# Patient Record
Sex: Female | Born: 1995 | Hispanic: No | Marital: Married | Smoking: Never smoker
Health system: Southern US, Community
[De-identification: ages and names within clinical notes are randomized; demographics above are authoritative.]

---

## 2018-04-14 ENCOUNTER — Other Ambulatory Visit: Payer: Self-pay

## 2018-04-14 ENCOUNTER — Emergency Department
Admit: 2018-04-14 | Discharge: 2018-04-14 | Disposition: A | Discharge status: Home / Self Care | Payer: Self-pay | Attending: Emergency Medicine | Admitting: Emergency Medicine

## 2018-04-14 ENCOUNTER — Emergency Department: Payer: Self-pay

## 2018-04-14 ENCOUNTER — Inpatient Hospital Stay
Admission: EM | Admit: 2018-04-14 | Discharge: 2018-04-16 | DRG: 316 | Disposition: A | Discharge status: Home / Self Care | Payer: Self-pay | Attending: Internal Medicine | Admitting: Internal Medicine

## 2018-04-14 DIAGNOSIS — E860 Dehydration: Secondary | ICD-10-CM | POA: Diagnosis present

## 2018-04-14 DIAGNOSIS — E876 Hypokalemia: Secondary | ICD-10-CM | POA: Diagnosis not present

## 2018-04-14 DIAGNOSIS — J020 Streptococcal pharyngitis: Secondary | ICD-10-CM | POA: Diagnosis present

## 2018-04-14 DIAGNOSIS — I959 Hypotension, unspecified: Secondary | ICD-10-CM | POA: Diagnosis present

## 2018-04-14 DIAGNOSIS — I409 Acute myocarditis, unspecified: Secondary | ICD-10-CM

## 2018-04-14 DIAGNOSIS — I514 Myocarditis, unspecified: Principal | ICD-10-CM | POA: Diagnosis present

## 2018-04-14 DIAGNOSIS — R079 Chest pain, unspecified: Secondary | ICD-10-CM | POA: Diagnosis present

## 2018-04-14 DIAGNOSIS — Z79899 Other long term (current) drug therapy: Secondary | ICD-10-CM

## 2018-04-14 LAB — BASIC METABOLIC PANEL
Anion gap: 9 (ref 5–15)
BUN: 7 mg/dL (ref 6–20)
CALCIUM: 8.7 mg/dL — AB (ref 8.9–10.3)
CO2: 27 mmol/L (ref 22–32)
Chloride: 100 mmol/L (ref 98–111)
Creatinine, Ser: 0.67 mg/dL (ref 0.44–1.00)
GFR calc Af Amer: >60 mL/min (ref >60)
GLUCOSE: 95 mg/dL (ref 70–99)
Potassium: 3.2 mmol/L — ABNORMAL LOW (ref 3.5–5.1)
Sodium: 136 mmol/L (ref 135–145)

## 2018-04-14 LAB — CBC
HEMATOCRIT: 34.7 % — AB (ref 35.0–47.0)
Hemoglobin: 11.7 g/dL — ABNORMAL LOW (ref 12.0–16.0)
MCH: 30.3 pg (ref 26.0–34.0)
MCHC: 33.6 g/dL (ref 32.0–36.0)
MCV: 90.2 fL (ref 80.0–100.0)
Platelets: 295 10*3/uL (ref 150–440)
RBC: 3.84 MIL/uL (ref 3.80–5.20)
RDW: 12.6 % (ref 11.5–14.5)
WBC: 16.1 10*3/uL — ABNORMAL HIGH (ref 3.6–11.0)

## 2018-04-14 LAB — POCT PREGNANCY, URINE: PREG TEST UR: NEGATIVE

## 2018-04-14 LAB — TROPONIN I
Troponin I: 8.41 ng/mL (ref <0.03)
Troponin I: 13.23 ng/mL (ref <0.03)

## 2018-04-14 LAB — GROUP A STREP BY PCR: Group A Strep by PCR: NOT DETECTED

## 2018-04-14 LAB — ECHOCARDIOGRAM COMPLETE
Height: 64 in
Weight: 2848 oz

## 2018-04-14 MED ORDER — SODIUM CHLORIDE 0.9 % IV SOLN
1.0000 g | INTRAVENOUS | Status: DC
  Administered 2018-04-15: 1 g via INTRAVENOUS
  Filled 2018-04-14: qty 1
  Filled 2018-04-14: qty 10

## 2018-04-14 MED ORDER — ACETAMINOPHEN 650 MG RE SUPP: 650.0000 mg | Freq: Four times a day (QID) | RECTAL | Status: DC | PRN

## 2018-04-14 MED ORDER — HYDROCODONE-ACETAMINOPHEN 5-325 MG PO TABS
1.0000 | ORAL_TABLET | ORAL | Status: DC | PRN
  Administered 2018-04-15: 1 via ORAL
  Filled 2018-04-14: qty 1

## 2018-04-14 MED ORDER — ACETAMINOPHEN 325 MG PO TABS
650.0000 mg | ORAL_TABLET | Freq: Four times a day (QID) | ORAL | Status: DC | PRN
  Filled 2018-04-14: qty 2

## 2018-04-14 MED ORDER — ONDANSETRON HCL 4 MG/2ML IJ SOLN: 4.0000 mg | Freq: Four times a day (QID) | INTRAMUSCULAR | Status: DC | PRN

## 2018-04-14 MED ORDER — SODIUM CHLORIDE 0.9 % IV SOLN
INTRAVENOUS | Status: DC
  Administered 2018-04-14 – 2018-04-15 (×2): via INTRAVENOUS

## 2018-04-14 MED ORDER — CEFTRIAXONE SODIUM 1 G IJ SOLR
1.0000 g | Freq: Once | INTRAMUSCULAR | Status: AC
  Administered 2018-04-14: 1 g via INTRAVENOUS
  Filled 2018-04-14: qty 10

## 2018-04-14 MED ORDER — HEPARIN SODIUM (PORCINE) 5000 UNIT/ML IJ SOLN
5000.0000 [IU] | Freq: Three times a day (TID) | INTRAMUSCULAR | Status: DC
  Administered 2018-04-14 – 2018-04-16 (×5): 5000 [IU] via SUBCUTANEOUS
  Filled 2018-04-14 (×5): qty 1

## 2018-04-14 MED ORDER — SODIUM CHLORIDE 0.9 % IV SOLN: 1.0000 g | INTRAVENOUS | Status: DC

## 2018-04-14 MED ORDER — ONDANSETRON HCL 4 MG PO TABS: 4.0000 mg | ORAL_TABLET | Freq: Four times a day (QID) | ORAL | Status: DC | PRN

## 2018-04-14 MED ORDER — ASPIRIN 81 MG PO CHEW
324.0000 mg | CHEWABLE_TABLET | Freq: Once | ORAL | Status: AC
  Administered 2018-04-14: 324 mg via ORAL
  Filled 2018-04-14: qty 4

## 2018-04-14 NOTE — ED Triage Notes (Signed)
Pt presented today from home for chest pain and SOB that started Sunday. +cough and fevers. She is complaining of bilateral ear pain. She was seen at her doctor on Thursday and dx with strep. She was prescribed amoxicillin.

## 2018-04-14 NOTE — ED Notes (Signed)
Date and time results received: 04/14/18 5:04 PM    Test: Troponin Critical Value: 8.41  Name of Provider Notified: Dr. Lenard LancePaduchowski  Orders Received? Or Actions Taken?: Actions Taken: taken to room

## 2018-04-14 NOTE — H&P (Addendum)
[facility] [place] [facility]   PATIENT NAME: Dorothy Berry Lucken    MR#:  [id number]  DATE OF BIRTH:  10/08/1996  DATE OF ADMISSION:  04/14/2018  PRIMARY CARE PHYSICIAN: Patient, No Pcp Per   REQUESTING/REFERRING PHYSICIAN: Dr. Minna AntisKevin Paduchowski  CHIEF COMPLAINT: Chest pain   Chief Complaint  Patient presents with  . Chest Pain    HISTORY OF PRESENT ILLNESS:  Dorothy Berry Tinnell  is a 22 y.o. female no past medical history recently diagnosed with strep throat started taking antibiotics since last Thursday.  According to patient started to have strep throat pain since Tuesday of last week went to urgent care on Thursday and was given amoxicillin.  Patient developed chest pain, shortness of breath and cough which got worse today.  Temperature 100.2 F, patient also is tachycardic at heart rate 120 bpm.  Because of chest pain shortness of breath had been evaluated in the emergency room.  Patient found to have a white count of 16, troponin up to 8.41 avascular to admit the patient.  Patient had pleuritic chest pain and mild shortness of breath.  And had bedside echo done but report is pending.  Because of chest pain in the middle of the chest. PAST MEDICAL HISTORY:  History reviewed. No pertinent past medical history. Patient has no past medical history of hypertension or diabetes. PAST SURGICAL HISTOIRY:  History reviewed. No pertinent surgical history. Denies Any surgeries. SOCIAL HISTORY:   Social History   Tobacco Use  . Smoking status: Not on file  Substance Use Topics  . Alcohol use: Not on file    FAMILY HISTORY:  No family history on file.  DRUG ALLERGIES:  No Known Allergies  REVIEW OF SYSTEMS:  CONSTITUTIONAL: No fever, fatigue or weakness.  EYES: No blurred or double vision.  EARS, NOSE, AND THROAT: No tinnitus or ear pain.  RESPIRATORY:, Chest pain, shortness of breath.Marland Kitchen.  CARDIOVASCULAR: No orthopnea or PND or pedal edema gASTROINTESTINAL: No  nausea, vomiting, diarrhea or abdominal pain.  GENITOURINARY: No dysuria, hematuria.  ENDOCRINE: No polyuria, nocturia,  HEMATOLOGY: No anemia, easy bruising or bleeding SKIN: No rash or lesion. MUSCULOSKELETAL: No joint pain or arthritis.   NEUROLOGIC: No tingling, numbness, weakness.  PSYCHIATRY: No anxiety or depression.   MEDICATIONS AT HOME:   Prior to Admission medications   Medication Sig Start Date End Date Taking? Authorizing Provider  amoxicillin (AMOXIL) 875 MG tablet Take 875 mg by mouth 2 (two) times daily. 04/08/18 04/18/18 Yes [provider]      VITAL SIGNS:  Blood pressure 105/76, pulse (!) 102, temperature 100.2 F (37.9 C), temperature source Oral, resp. rate (!) 30, height 5\' 4"  (1.626 m), weight 80.7 kg (178 lb), last menstrual period 04/06/2018, SpO2 100 %.  PHYSICAL EXAMINATION:  GENERAL:  22 y.o.-year-old patient lying in the bed with no acute distress.  EYES: Pupils equal, round, reactive to light and accommodation. No scleral icterus. Extraocular muscles intact.  HEENT: Head atraumatic, normocephalic. Oropharynx and nasopharynx clear.  NECK:  Supple, no jugular venous distention. No thyroid enlargement, no tenderness.  LUNGS: Normal breath sounds bilaterally, no wheezing, rales,rhonchi or crepitation. No use of accessory muscles of respiration.  CARDIOVASCULAR: S1, S2 normal. No murmurs, rubs, or gallops.  Has no pericardial friction rub. ABDOMEN: Soft, nontender, nondistended. Bowel sounds present. No organomegaly or mass.  EXTREMITIES: No pedal edema, cyanosis, or clubbing.  NEUROLOGIC: Cranial nerves II through XII are intact. Muscle strength 5/5 in all extremities. Sensation intact.  Gait not checked.  PSYCHIATRIC: The patient is alert and oriented x 3.  SKIN: No obvious rash, lesion, or ulcer.   LABORATORY PANEL:   CBC Recent Labs  Lab 04/14/18 1628  WBC 16.1*  HGB 11.7*  HCT 34.7*  PLT 295    ------------------------------------------------------------------------------------------------------------------  Chemistries  Recent Labs  Lab 04/14/18 1628  NA 136  K 3.2*  CL 100  CO2 27  GLUCOSE 95  BUN 7  CREATININE 0.67  CALCIUM 8.7*   ------------------------------------------------------------------------------------------------------------------  Cardiac Enzymes Recent Labs  Lab 04/14/18 1628  TROPONINI 8.41*   ------------------------------------------------------------------------------------------------------------------  RADIOLOGY:  Dg Chest 2 View  Result Date: 04/14/2018 CLINICAL DATA:  Intermittent chest pain at central chest since Sunday, developing shortness of breath, lower chest pain as well EXAM: CHEST - 2 VIEW COMPARISON:  None FINDINGS: Normal heart size, mediastinal contours, and pulmonary vascularity. Lungs clear. No pleural effusion or pneumothorax. Bones unremarkable. IMPRESSION: Normal exam. Electronically Signed   By: Ulyses Southward M.D.   On: 04/14/2018 16:51    EKG:   Orders placed or performed during the hospital encounter of 04/14/18  . ED EKG  . ED EKG  . ED EKG within 10 minutes  . ED EKG within 10 minutes   Shows sinus tachycardia with nonspecific ST-T changes. IMPRESSION AND PLAN:   22 year old female with recent strep throat infection as chest pain, shortness of breath and found to have elevated troponins without EKG changes.  Patient likely has pleuritic chest pain, cardiology recommends treating underlying strep throat.  Patient already receiving Rocephin.  Patient had bedside echo done result is pending.  At this time patient is too young to have CAD.  Continue symptomatic treatment with narcotic pain medicines, antibiotics.  Hold off on aspirin, beta-blockers, full dose Lovenox until recommended by cardiology.  Discussed with patient's mom.  cycletroponins, monitor on telemetry.     2.Sinus tachycardia, hypotension likely due to  dehydration: Continue aggressive hydration.   #3. possible myocarditis.  Continue NSAIDS All the records are reviewed and case discussed with ED provider. Management plans discussed with the patient, family and they are in agreement.  CODE STATUS: full TOTAL TIME TAKING CARE OF THIS PATIENT: 55 minutes.   Discussed with patient's mom, cardiologist for about 15 to 20 minutes. Katha Hamming M.D on 04/14/2018 at 7:49 PM  Between 7am to 6pm - Pager - [phone]  After 6pm go to [email or web address] - password [email or web address]  [facility]  Office  [phone]  CC: Primary care physician; Patient, No Pcp Per  Note: This dictation was prepared with Dragon dictation along with smaller phrase technology. Any transcriptional errors that result from this process are unintentional.

## 2018-04-14 NOTE — Progress Notes (Signed)
*  PRELIMINARY RESULTS* Echocardiogram 2D Echocardiogram has been performed.  Dorothy Berry 04/14/2018, 8:01 PM 

## 2018-04-14 NOTE — Consult Note (Signed)
Dorothy Berry is Berry 22 y.o. female  [id number]  Primary Cardiologist: Dorothy Canningshaukat khanReason for Consultation: chest pain  HPI: [name] came with chest pain fever and chills. Chest pain is pleuritic.   Review of Systems: fever chills and strep throat   History reviewed. No pertinent past medical history.   (Not in Berry hospital admission)     Infusions:   No Known Allergies  Social History   Socioeconomic History  . Marital status: Married    Spouse name: Not on file  . Number of children: Not on file  . Years of education: Not on file  . Highest education level: Not on file  Occupational History  . Not on file  Social Needs  . Financial resource strain: Not on file  . Food insecurity:    Worry: Not on file    Inability: Not on file  . Transportation needs:    Medical: Not on file    Non-medical: Not on file  Tobacco Use  . Smoking status: Not on file  Substance and Sexual Activity  . Alcohol use: Not on file  . Drug use: Not on file  . Sexual activity: Not on file  Lifestyle  . Physical activity:    Days per week: Not on file    Minutes per session: Not on file  . Stress: Not on file  Relationships  . Social connections:    Talks on phone: Not on file    Gets together: Not on file    Attends religious service: Not on file    Active member of club or organization: Not on file    Attends meetings of clubs or organizations: Not on file    Relationship status: Not on file  . Intimate partner violence:    Fear of current or ex partner: Not on file    Emotionally abused: Not on file    Physically abused: Not on file    Forced sexual activity: Not on file  Other Topics Concern  . Not on file  Social History Narrative  . Not on file    No family history on file.  PHYSICAL EXAM: Vitals:   04/14/18 1815 04/14/18 1830  BP:  105/76  Pulse: (!) 101 (!) 102  Resp: 18 (!) 30  Temp:    SpO2: 100% 100%    No intake or output data in the 24 hours ending  04/14/18 1944  General:  Well appearing. No respiratory difficulty HEENT: normal Neck: supple. no JVD. Carotids 2+ bilat; no bruits. No lymphadenopathy or thryomegaly appreciated. Cor: PMI nondisplaced. Regular rate & rhythm. No rubs, gallops or murmurs. Lungs: clear Abdomen: soft, nontender, nondistended. No hepatosplenomegaly. No bruits or masses. Good bowel sounds. Extremities: no cyanosis, clubbing, rash, edema Neuro: alert & oriented x 3, cranial nerves grossly intact. moves all 4 extremities w/o difficulty. Affect pleasant.  ECG: sinus tachycardia non specific st and t changes NO STEMI  Results for orders placed or performed during the hospital encounter of 04/14/18 (from the past 24 hour(s))  Basic metabolic panel     Status: Abnormal   Collection Time: 04/14/18  4:28 PM  Result Value Ref Range   Sodium 136 135 - 145 mmol/L   Potassium 3.2 (L) 3.5 - 5.1 mmol/L   Chloride 100 98 - 111 mmol/L   CO2 27 22 - 32 mmol/L   Glucose, Bld 95 70 - 99 mg/dL   BUN 7 6 - 20 mg/dL   Creatinine, Ser 7.820.67  0.44 - 1.00 mg/dL   Calcium 8.7 (L) 8.9 - 10.3 mg/dL   GFR calc non Af Amer >60 >60 mL/min   GFR calc Af Amer >60 >60 mL/min   Anion gap 9 5 - 15  CBC     Status: Abnormal   Collection Time: 04/14/18  4:28 PM  Result Value Ref Range   WBC 16.1 (H) 3.6 - 11.0 K/uL   RBC 3.84 3.80 - 5.20 MIL/uL   Hemoglobin 11.7 (L) 12.0 - 16.0 g/dL   HCT 16.1 (L) 09.6 - 04.5 %   MCV 90.2 80.0 - 100.0 fL   MCH 30.3 26.0 - 34.0 pg   MCHC 33.6 32.0 - 36.0 g/dL   RDW 40.9 81.1 - 91.4 %   Platelets 295 150 - 440 K/uL  Troponin I     Status: Abnormal   Collection Time: 04/14/18  4:28 PM  Result Value Ref Range   Troponin I 8.41 (HH) <0.03 ng/mL  Pregnancy, urine POC     Status: None   Collection Time: 04/14/18  4:38 PM  Result Value Ref Range   Preg Test, Ur NEGATIVE NEGATIVE  Group Berry Strep by PCR     Status: None   Collection Time: 04/14/18  5:15 PM  Result Value Ref Range   Group Berry Strep by PCR  NOT DETECTED NOT DETECTED   Dg Chest 2 View  Result Date: 04/14/2018 CLINICAL DATA:  Intermittent chest pain at central chest since Sunday, developing shortness of breath, lower chest pain as well EXAM: CHEST - 2 VIEW COMPARISON:  None FINDINGS: Normal heart size, mediastinal contours, and pulmonary vascularity. Lungs clear. No pleural effusion or pneumothorax. Bones unremarkable. IMPRESSION: Normal exam. Electronically Signed   By: Dorothy Southward M.D.   On: 04/14/2018 16:51     ASSESSMENT AND PLAN:  Pleuritic Chest pain with elevated troponins. Has fever and strep throat and advise asp and treating underlying cause with antibiotics and as due to myopericarditis as possibility as too young to have CAD>  Dorothy Berry

## 2018-04-14 NOTE — ED Provider Notes (Signed)
[facility] Emergency Department Provider Note  Time seen: 5:15 PM  I have reviewed the triage vital signs and the nursing notes.   HISTORY  Chief Complaint Chest Pain    HPI Dorothy Berry is a 22 y.o. female with no past medical history presents to the emergency department for chest pain.  According to the patient on Tuesday she began with a sore throat, went to an urgent care on Thursday had a positive strep throat test and was started on amoxicillin.  Patient has been taking the amoxicillin, Sunday she developed chest discomfort with mild shortness of breath as well as a mild cough.  Patient states that chest pain worsened today so she came to the emergency department for evaluation.  Upon arrival patient is noted to have a low-grade temperature of 100.2 tachycardic around 125.   History reviewed. No pertinent past medical history.  There are no active problems to display for this patient.   History reviewed. No pertinent surgical history.  Prior to Admission medications   Not on File    No Known Allergies  No family history on file.  Social History Social History   Tobacco Use  . Smoking status: Not on file  Substance Use Topics  . Alcohol use: Not on file  . Drug use: Not on file    Review of Systems Constitutional: Intermittent fevers over the past 1 week. Eyes: Negative for visual complaints ENT: Mild sore throat much improved from several days ago per patient Cardiovascular: Chest pain, moderate dull aching pain in the center of her chest worsening over the past 3 days. Respiratory: Mild shortness of breath at times Gastrointestinal: Negative for abdominal pain, vomiting and diarrhea. Genitourinary: Negative for urinary compaints Musculoskeletal: Negative for leg pain or swelling Skin: Negative for skin complaints  Neurological: Negative for headache All other ROS  negative  ____________________________________________   PHYSICAL EXAM:  VITAL SIGNS: ED Triage Vitals  Enc Vitals Group     BP 04/14/18 1617 125/74     Pulse Rate 04/14/18 1617 (!) 126     Resp 04/14/18 1617 16     Temp 04/14/18 1617 100.2 F (37.9 C)     Temp Source 04/14/18 1617 Oral     SpO2 04/14/18 1617 100 %     Weight 04/14/18 1616 178 lb (80.7 kg)     Height 04/14/18 1616 5\' 4"  (1.626 m)     Head Circumference --      Peak Flow --      Pain Score 04/14/18 1620 6     Pain Loc --      Pain Edu? --      Excl. in GC? --     Constitutional: Alert and oriented. Well appearing and in no distress. Eyes: Normal exam ENT   Head: Normocephalic and atraumatic.   Mouth/Throat: Mucous membranes are moist. Cardiovascular: Regular rhythm, rate around 120.  No obvious murmur rub or gallop. Respiratory: Normal respiratory effort without tachypnea nor retractions. Breath sounds are clear Gastrointestinal: Soft and nontender. No distention.  Musculoskeletal: Nontender with normal range of motion in all extremities. No lower extremity tenderness or edema. Neurologic:  Normal speech and language. No gross focal neurologic deficits Skin:  Skin is warm, dry and intact.  Psychiatric: Mood and affect are normal. Speech and behavior are normal.   ____________________________________________    EKG  EKG reviewed and interpreted by myself shows sinus tachycardia 108 bpm with a narrow QRS, normal axis, normal intervals, no  concerning ST changes.  ____________________________________________    RADIOLOGY  X-ray negative  ____________________________________________   INITIAL IMPRESSION / ASSESSMENT AND PLAN / ED COURSE  Pertinent labs & imaging results that were available during my care of the patient were reviewed by me and considered in my medical decision making (see chart for details).  Patient presents to the emergency department for worsening chest discomfort with  mild shortness of breath found to have low-grade fever and a positive troponin of 8.4.  Differential would include ACS, NSTEMI, myocarditis, pericarditis.  Given the patient's low-grade fever and tachycardia with recent strep throat infection highly suspect myocarditis or possible myocarditis/pericarditis.  We will check blood cultures, patient does have an elevated white blood cell count of 16,000.  We will start the patient on empiric Rocephin through her IV.  I discussed the patient with Dr. Welton Flakes of cardiology.  Recommends holding off on heparin at this time given the likelihood of myocarditis.  Recommends dosing aspirin and ordering an echocardiogram in addition to IV antibiotics.  I will order an echocardiogram from the emergency department.  We will admit the patient to the hospitalist service for continued treatment.  CRITICAL CARE Performed by: Minna Antis   Total critical care time: 30 minutes  Critical care time was exclusive of separately billable procedures and treating other patients.  Critical care was necessary to treat or prevent imminent or life-threatening deterioration.  Critical care was time spent personally by me on the following activities: development of treatment plan with patient and/or surrogate as well as nursing, discussions with consultants, evaluation of patient's response to treatment, examination of patient, obtaining history from patient or surrogate, ordering and performing treatments and interventions, ordering and review of laboratory studies, ordering and review of radiographic studies, pulse oximetry and re-evaluation of patient's condition.   ____________________________________________   FINAL CLINICAL IMPRESSION(S) / ED DIAGNOSES  Myocarditis    Minna Antis, MD 04/14/18 1721

## 2018-04-15 LAB — BASIC METABOLIC PANEL
Anion gap: 7 (ref 5–15)
Anion gap: 7 (ref 5–15)
BUN: 6 mg/dL (ref 6–20)
CO2: 27 mmol/L (ref 22–32)
CO2: 26 mmol/L (ref 22–32)
CREATININE: 0.66 mg/dL (ref 0.44–1.00)
CREATININE: 0.59 mg/dL (ref 0.44–1.00)
Calcium: 8.3 mg/dL — ABNORMAL LOW (ref 8.9–10.3)
Calcium: 8.4 mg/dL — ABNORMAL LOW (ref 8.9–10.3)
Chloride: 104 mmol/L (ref 98–111)
Chloride: 108 mmol/L (ref 98–111)
GFR calc Af Amer: >60 mL/min (ref >60)
GFR calc non Af Amer: >60 mL/min (ref >60)
GLUCOSE: 114 mg/dL — AB (ref 70–99)
Glucose, Bld: 110 mg/dL — ABNORMAL HIGH (ref 70–99)
Potassium: 3 mmol/L — ABNORMAL LOW (ref 3.5–5.1)
Potassium: 3.8 mmol/L (ref 3.5–5.1)
SODIUM: 138 mmol/L (ref 135–145)
SODIUM: 141 mmol/L (ref 135–145)

## 2018-04-15 LAB — CBC
HCT: 32.1 % — ABNORMAL LOW (ref 35.0–47.0)
HEMATOCRIT: 31.8 % — AB (ref 35.0–47.0)
Hemoglobin: 10.5 g/dL — ABNORMAL LOW (ref 12.0–16.0)
Hemoglobin: 10.5 g/dL — ABNORMAL LOW (ref 12.0–16.0)
MCH: 30 pg (ref 26.0–34.0)
MCH: 29.5 pg (ref 26.0–34.0)
MCHC: 33.1 g/dL (ref 32.0–36.0)
MCHC: 32.6 g/dL (ref 32.0–36.0)
MCV: 90.6 fL (ref 80.0–100.0)
MCV: 90.6 fL (ref 80.0–100.0)
PLATELETS: 303 10*3/uL (ref 150–440)
PLATELETS: 296 10*3/uL (ref 150–440)
RBC: 3.51 MIL/uL — ABNORMAL LOW (ref 3.80–5.20)
RBC: 3.55 MIL/uL — ABNORMAL LOW (ref 3.80–5.20)
RDW: 12.6 % (ref 11.5–14.5)
RDW: 12.3 % (ref 11.5–14.5)
WBC: 15.4 10*3/uL — AB (ref 3.6–11.0)
WBC: 14.3 10*3/uL — ABNORMAL HIGH (ref 3.6–11.0)

## 2018-04-15 LAB — PROTIME-INR
INR: 1.24
PROTHROMBIN TIME: 15.5 s — AB (ref 11.4–15.2)

## 2018-04-15 LAB — TROPONIN I
Troponin I: 11.92 ng/mL (ref <0.03)
Troponin I: 10.95 ng/mL (ref <0.03)

## 2018-04-15 LAB — MAGNESIUM: MAGNESIUM: 2 mg/dL (ref 1.7–2.4)

## 2018-04-15 LAB — GLUCOSE, CAPILLARY: Glucose-Capillary: 82 mg/dL (ref 70–99)

## 2018-04-15 MED ORDER — SODIUM CHLORIDE 0.9% FLUSH
3.0000 mL | Freq: Two times a day (BID) | INTRAVENOUS | Status: DC
  Administered 2018-04-15: 3 mL via INTRAVENOUS

## 2018-04-15 MED ORDER — BENZONATATE 100 MG PO CAPS
100.0000 mg | ORAL_CAPSULE | Freq: Three times a day (TID) | ORAL | Status: DC | PRN
  Administered 2018-04-15 (×2): 100 mg via ORAL
  Filled 2018-04-15 (×2): qty 1

## 2018-04-15 MED ORDER — INDOMETHACIN 50 MG PO CAPS
50.0000 mg | ORAL_CAPSULE | Freq: Three times a day (TID) | ORAL | Status: DC
  Administered 2018-04-15 – 2018-04-16 (×3): 50 mg via ORAL
  Filled 2018-04-15 (×5): qty 1

## 2018-04-15 MED ORDER — SODIUM CHLORIDE 0.9% FLUSH: 3.0000 mL | INTRAVENOUS | Status: DC | PRN

## 2018-04-15 MED ORDER — SODIUM CHLORIDE 0.9 % IV SOLN: 250.0000 mL | INTRAVENOUS | Status: DC | PRN

## 2018-04-15 MED ORDER — ASPIRIN 81 MG PO CHEW
81.0000 mg | CHEWABLE_TABLET | ORAL | Status: AC
  Administered 2018-04-16: 81 mg via ORAL
  Filled 2018-04-15: qty 1

## 2018-04-15 MED ORDER — COLCHICINE 0.6 MG PO TABS
0.6000 mg | ORAL_TABLET | Freq: Two times a day (BID) | ORAL | Status: DC
  Administered 2018-04-15 – 2018-04-16 (×3): 0.6 mg via ORAL
  Filled 2018-04-15 (×3): qty 1

## 2018-04-15 MED ORDER — ASPIRIN EC 81 MG PO TBEC
81.0000 mg | DELAYED_RELEASE_TABLET | Freq: Every day | ORAL | Status: DC
  Administered 2018-04-15 – 2018-04-16 (×2): 81 mg via ORAL
  Filled 2018-04-15 (×2): qty 1

## 2018-04-15 MED ORDER — PREMIER PROTEIN SHAKE
11.0000 [oz_av] | Freq: Two times a day (BID) | ORAL | Status: DC
  Administered 2018-04-15 – 2018-04-16 (×2): 11 [oz_av] via ORAL

## 2018-04-15 MED ORDER — POTASSIUM CHLORIDE CRYS ER 20 MEQ PO TBCR
40.0000 meq | EXTENDED_RELEASE_TABLET | Freq: Two times a day (BID) | ORAL | Status: AC
  Administered 2018-04-15 (×2): 40 meq via ORAL
  Filled 2018-04-15 (×2): qty 2

## 2018-04-15 MED ORDER — SODIUM CHLORIDE 0.9 % WEIGHT BASED INFUSION: 3.0000 mL/kg/h | INTRAVENOUS | Status: AC

## 2018-04-15 MED ORDER — HYDROCODONE-ACETAMINOPHEN 5-325 MG PO TABS
1.0000 | ORAL_TABLET | Freq: Four times a day (QID) | ORAL | Status: DC | PRN
  Administered 2018-04-15: 2 via ORAL
  Filled 2018-04-15: qty 2

## 2018-04-15 MED ORDER — SODIUM CHLORIDE 0.9 % WEIGHT BASED INFUSION: 1.0000 mL/kg/h | INTRAVENOUS | Status: DC

## 2018-04-15 MED ORDER — GUAIFENESIN 100 MG/5ML PO SOLN
5.0000 mL | ORAL | Status: DC | PRN
  Administered 2018-04-15 (×2): 100 mg via ORAL
  Filled 2018-04-15 (×3): qty 5

## 2018-04-15 NOTE — Plan of Care (Signed)
  Problem: Education: Goal: Knowledge of General Education information will improve Outcome: Progressing   Problem: Clinical Measurements: Goal: Will remain free from infection Outcome: Progressing Goal: Respiratory complications will improve Outcome: Progressing

## 2018-04-15 NOTE — Progress Notes (Signed)
Potasium of 3.0 was called to Dr.Sridharan, Prasanna. New order of of potasium received. Will continue to monitor

## 2018-04-15 NOTE — Progress Notes (Signed)
Nutrition Brief Note  Patient identified on the Malnutrition Screening Tool (MST) Report  22 y/o female admitted with strept throat and chest pain   Met with pt in room today. Pt reports good appetite and oral intake pta and today. Pt reports her throat is still sore today but she is able to eat and ate 100% of breakfast. Pt reports her weight is stable. Pt would like to have Premier Protein in between meals; RD will order.   Wt Readings from Last 15 Encounters:  04/15/18 186 lb 4.8 oz (84.5 kg)    Body mass index is 31.98 kg/m. Patient meets criteria for obesity based on current BMI.   Current diet order is regular, patient is consuming approximately 100% of meals at this time. Labs and medications reviewed.   No nutrition interventions warranted at this time. If nutrition issues arise, please consult RD.   Koleen Distance MS, RD, LDN Pager #- [phone] Office#- [phone] After Hours Pager: [phone]

## 2018-04-15 NOTE — Progress Notes (Signed)
[facility] - [place] at [facility]   PATIENT NAME: Dorothy Berry    MR#:  [id number]  DATE OF BIRTH:  [birth year]  SUBJECTIVE: Admitted yesterday for chest pain/.shunt is treated with antibiotics for strep throat.  Patient suddenly noted to have chest pain, shortness of breath at home.  I on IV Rocephin.  Patient had bedside echo done in the emergency room yesterday showed normal LV function.  Chest pain is slightly better but she is tachycardic.  Does have some cough.  CHIEF COMPLAINT:   Chief Complaint  Patient presents with  . Chest Pain    REVIEW OF SYSTEMS:   Review of Systems  Constitutional: Positive for fever. Negative for chills.  HENT: Negative for hearing loss.   Eyes: Negative for blurred vision, double vision and photophobia.  Respiratory: Positive for cough. Negative for hemoptysis and shortness of breath.   Cardiovascular: Positive for chest pain. Negative for palpitations, orthopnea and leg swelling.  Gastrointestinal: Negative for abdominal pain, diarrhea and vomiting.  Genitourinary: Negative for dysuria and urgency.  Musculoskeletal: Negative for myalgias and neck pain.  Skin: Negative for rash.  Neurological: Negative for dizziness, focal weakness, seizures, weakness and headaches.  Psychiatric/Behavioral: Negative for memory loss. The patient does not have insomnia.    CONSTITUTIONAL: Low-grade fever  yES: No blurred or double vision.  EARS, NOSE, AND THROAT: No tinnitus or ear pain.  RESPIRATORY: Dry cough, no shortness of breath.  CARDIOVASCULAR: Pleuritic chest pain gASTROINTESTINAL: No nausea, vomiting, diarrhea or abdominal pain.  GENITOURINARY: No dysuria, hematuria.  ENDOCRINE: No polyuria, nocturia,  HEMATOLOGY: No anemia, easy bruising or bleeding SKIN: No rash or lesion. MUSCULOSKELETAL: No joint pain or arthritis.   NEUROLOGIC: No tingling, numbness, weakness.  PSYCHIATRY: No anxiety or depression.   DRUG ALLERGIES:   No Known Allergies  VITALS:  Blood pressure 92/61, pulse (!) 105, temperature 99.5 F (37.5 C), temperature source Oral, resp. rate 18, height 5\' 4"  (1.626 m), weight 84.5 kg (186 lb 4.8 oz), last menstrual period 04/06/2018, SpO2 97 %.  PHYSICAL EXAMINATION:  GENERAL:  22 y.o.-year-old patient lying in the bed with no acute distress.  EYES: Pupils equal, round, reactive to light and accommodation. No scleral icterus. Extraocular muscles intact.  HEENT: Head atraumatic, normocephalic. Oropharynx and nasopharynx clear.  NECK:  Supple, no jugular venous distention. No thyroid enlargement, no tenderness.  LUNGS: Normal breath sounds bilaterally, no wheezing, rales,rhonchi or crepitation. No use of accessory muscles of respiration.  CARDIOVASCULAR: S1, S2 normal. No murmurs, rubs, or gallops.  ABDOMEN: Soft, nontender, nondistended. Bowel sounds present. No organomegaly or mass.  EXTREMITIES: No pedal edema, cyanosis, or clubbing.  NEUROLOGIC: Cranial nerves II through XII are intact. Muscle strength 5/5 in all extremities. Sensation intact. Gait not checked.  PSYCHIATRIC: The patient is alert and oriented x 3.  SKIN: No obvious rash, lesion, or ulcer.    LABORATORY PANEL:   CBC Recent Labs  Lab 04/15/18 0223  WBC 15.4*  HGB 10.5*  HCT 31.8*  PLT 303   ------------------------------------------------------------------------------------------------------------------  Chemistries  Recent Labs  Lab 04/15/18 0223 04/15/18 0754  NA 138  --   K 3.0*  --   CL 104  --   CO2 27  --   GLUCOSE 114*  --   BUN 6  --   CREATININE 0.66  --   CALCIUM 8.3*  --   MG  --  2.0   ------------------------------------------------------------------------------------------------------------------  Cardiac Enzymes Recent Labs  Lab 04/15/18 0754  TROPONINI 10.95*    ------------------------------------------------------------------------------------------------------------------  RADIOLOGY:  Dg Chest 2 View  Result Date: 04/14/2018 CLINICAL DATA:  Intermittent chest pain at central chest since Sunday, developing shortness of breath, lower chest pain as well EXAM: CHEST - 2 VIEW COMPARISON:  None FINDINGS: Normal heart size, mediastinal contours, and pulmonary vascularity. Lungs clear. No pleural effusion or pneumothorax. Bones unremarkable. IMPRESSION: Normal exam. Electronically Signed   By: Ulyses SouthwardMark  Boles M.D.   On: 04/14/2018 16:51    EKG:   Orders placed or performed during the hospital encounter of 04/14/18  . ED EKG  . ED EKG  . ED EKG within 10 minutes  . ED EKG within 10 minutes    ASSESSMENT AND PLAN:   22 year old female patient with recent strep throat infection started to have chest pain, shortness of breath and found to have elevated troponins. 1.  Pleuritic chest pain secondary to strep throat infection and cough with elevated troponins likely due to demand ischemia from strep throat rather than acute MI.  Patient echocardiogram showed EF within normal range, except septal hypokinesia continue conservative treatment. 2.  Possible myocarditis with septal hypokinesia, cardiology recommended colchicine, Indocin, started on that. 3.  Sinus tachycardia secondary to cough, acute infection: Continue IV fluids Robitussin, Tessalon Perles, narcotic use. 4.  Hypokalemia: Likely secondary to acute infection: Replace potassium.     All the records are reviewed and case discussed with Care Management/Social Workerr. Management plans discussed with the patient, family and they are in agreement.  CODE STATUS: FULL  TOTAL TIME TAKING CARE OF THIS PATIENT: 35 minutes.   POSSIBLE D/C IN 1-2DAYS, DEPENDING ON CLINICAL CONDITION.   Katha HammingSnehalatha Jacquelyne Quarry M.D on 04/15/2018 at 11:49 AM  Between 7am to 6pm - Pager - [phone]  After 6pm go to  [email or web address] - password [email or web address]  Fabio Neighbors[facility]  Office  [phone]  CC: Primary care physician; Patient, No Pcp Per   Note: This dictation was prepared with Dragon dictation along with smaller phrase technology. Any transcriptional errors that result from this process are unintentional.

## 2018-04-15 NOTE — Progress Notes (Signed)
SUBJECTIVE: patient is having chest pain and is crying with excruciating pain.   Vitals:   04/14/18 2000 04/14/18 2042 04/15/18 0459 04/15/18 0747  BP: 103/65 103/68 98/64 92/61   Pulse: (!) 105 (!) 121 (!) 130 (!) 105  Resp: (!) 28  18   Temp:  98.7 F (37.1 C) 99.4 F (37.4 C) 99.5 F (37.5 C)  TempSrc:  Oral Oral Oral  SpO2: 100% 100% 97% 97%  Weight:  183 lb 9.6 oz (83.3 kg) 186 lb 4.8 oz (84.5 kg)   Height:  5\' 4"  (1.626 m)      Intake/Output Summary (Last 24 hours) at 04/15/2018 1144 Last data filed at 04/15/2018 1038 Gross per 24 hour  Intake 1278.33 ml  Output 400 ml  Net 878.33 ml    LABS: Basic Metabolic Panel: Recent Labs    04/14/18 1628 04/15/18 0223 04/15/18 0754  NA 136 138  --   K 3.2* 3.0*  --   CL 100 104  --   CO2 27 27  --   GLUCOSE 95 114*  --   BUN 7 6  --   CREATININE 0.67 0.66  --   CALCIUM 8.7* 8.3*  --   MG  --   --  2.0   Liver Function Tests: No results for input(s): AST, ALT, ALKPHOS, BILITOT, PROT, ALBUMIN in the last 72 hours. No results for input(s): LIPASE, AMYLASE in the last 72 hours. CBC: Recent Labs    04/14/18 1628 04/15/18 0223  WBC 16.1* 15.4*  HGB 11.7* 10.5*  HCT 34.7* 31.8*  MCV 90.2 90.6  PLT 295 303   Cardiac Enzymes: Recent Labs    04/14/18 2003 04/15/18 0223 04/15/18 0754  TROPONINI 13.23* 11.92* 10.95*   BNP: Invalid input(s): POCBNP D-Dimer: No results for input(s): DDIMER in the last 72 hours. Hemoglobin A1C: No results for input(s): HGBA1C in the last 72 hours. Fasting Lipid Panel: No results for input(s): CHOL, HDL, LDLCALC, TRIG, CHOLHDL, LDLDIRECT in the last 72 hours. Thyroid Function Tests: No results for input(s): TSH, T4TOTAL, T3FREE, THYROIDAB in the last 72 hours.  Invalid input(s): FREET3 Anemia Panel: No results for input(s): VITAMINB12, FOLATE, FERRITIN, TIBC, IRON, RETICCTPCT in the last 72 hours.   PHYSICAL EXAM General: Well developed, well nourished, in no acute  distress HEENT:  Normocephalic and atramatic Neck:  No JVD.  Lungs: Clear bilaterally to auscultation and percussion. Heart: HRRR . Normal S1 and S2 without gallops or murmurs.  Abdomen: Bowel sounds are positive, abdomen soft and non-tender  Msk:  Back normal, normal gait. Normal strength and tone for age. Extremities: No clubbing, cyanosis or edema.   Neuro: Alert and oriented X 3. Psych:  Good affect, responds appropriately  TELEMETRY: sinus rhythm  ASSESSMENT AND PLAN: patient has myocarditis with troponin Mellody DanceKeith is going up. LV function was normal but there was septal hypokinesis. We will add colchicine as well as Indocin to see if that helps. If she doesn't get better may have to do cardiac catheterization.  Active Problems:   Chest pain    Dorothy Berry A, MD, [facility] Center[facility]ACC 04/15/2018 11:44 AM

## 2018-04-15 NOTE — Progress Notes (Signed)
Patient is a transferred to 2A from the ER via a wheelchair and was accompanied by her mother. On admission patient was ambulatory, denied being in pain. Patient and mother were oriented to the room, care plan reviewed. Patient elevated troponin of 13.2 was called to Dr. Caryn BeeMaier. No new order received . Will continue to monitor.  Patient remained in asymptomatic sinus tach with stable VS. No acute event overnight.

## 2018-04-16 ENCOUNTER — Encounter
Admission: EM | Disposition: A | Discharge status: Home / Self Care | Payer: Self-pay | Source: Home / Self Care | Attending: Internal Medicine

## 2018-04-16 LAB — GLUCOSE, CAPILLARY: Glucose-Capillary: 74 mg/dL (ref 70–99)

## 2018-04-16 SURGERY — LEFT HEART CATH AND CORONARY ANGIOGRAPHY
Anesthesia: Moderate Sedation | Laterality: Right

## 2018-04-16 MED ORDER — INDOMETHACIN 50 MG PO CAPS: 50.0000 mg | ORAL_CAPSULE | Freq: Three times a day (TID) | ORAL | 0 refills | Status: AC

## 2018-04-16 MED ORDER — BENZONATATE 100 MG PO CAPS: 100.0000 mg | ORAL_CAPSULE | Freq: Three times a day (TID) | ORAL | 0 refills | Status: AC | PRN

## 2018-04-16 MED ORDER — PREMIER PROTEIN SHAKE: 11.0000 [oz_av] | Freq: Two times a day (BID) | ORAL | 0 refills | Status: AC

## 2018-04-16 MED ORDER — COLCHICINE 0.6 MG PO TABS: 0.6000 mg | ORAL_TABLET | Freq: Two times a day (BID) | ORAL | 0 refills | Status: AC

## 2018-04-16 MED ORDER — GUAIFENESIN 100 MG/5ML PO SOLN: 5.0000 mL | ORAL | 0 refills | Status: AC | PRN

## 2018-04-16 NOTE — Discharge Summary (Signed)
Dorothy Berry, is a 22 y.o. female  DOB 05/15/1996  MRN [id number].  Admission date:  04/14/2018  Admitting Physician  Katha HammingSnehalatha Josede Cicero, MD  Discharge Date:  04/16/2018   Primary MD  Patient, No Pcp Per  Recommendations for primary care physician for things to follow:   Follow-up with Dr. Adrian BlackwaterShaukat Khan on Monday, July 8 at 9 AM   Admission Diagnosis  Acute myocarditis, unspecified myocarditis type [I40.9]   Discharge Diagnosis  Acute myocarditis, unspecified myocarditis type [I40.9]    Active Problems:   Chest pain      History reviewed. No pertinent past medical history.  History reviewed. No pertinent surgical history.     History of present illness and  Hospital Course:     Kindly see H&P for history of present illness and admission details, please review complete Labs, Consult reports and Test reports for all details in brief  HPI  from the history and physical done on the day of admission 22 year old female patient who was recently diagnosed with strep throat infection comes in because of chest pain, shortness of breath and found to have elevated troponin up to 8.41 so patient is admitted to telemetry for further work-up.   Hospital Course  1 acute midsternal chest pain secondary to myocarditis: Patient troponins peaked up to 13.23 concerning that seen by cardiology patient had bedside echocardiogram which showed normal ejection fraction 65% with hypokinesia of inferior septal wall.  Started on Indocin, colchicine by cardiology.  After starting this medicine patient chest pain decreased.  Patient advised to continue Indocin, colchicine and see Dr. Wynelle LinkShaukat: On Monday for possible repeat echocardiogram. 2 sinus tachycardia secondary to cough, acute infection, Patient received aggressive hydration, Robitussin,  Tessalon Perles 3.  Acute strep throat infection with elevated white count, fever: Patient received IV Rocephin since admission, patient can continue her home dose of amoxicillin and finish the course for strep throat infection.  The patient is afebrile.  Patient appears comfortable now, denies any chest pain.  Hemodynamically stable and eager to go home.       Discharge Condition: Stable   Follow UP      Discharge Instructions  and  Discharge Medications      Allergies as of 04/16/2018   No Known Allergies     Medication List    TAKE these medications   amoxicillin 875 MG tablet Commonly known as:  AMOXIL Take 875 mg by mouth 2 (two) times daily.   benzonatate 100 MG capsule Commonly known as:  TESSALON Take 1 capsule (100 mg total) by mouth 3 (three) times daily as needed for cough.   colchicine 0.6 MG tablet Take 1 tablet (0.6 mg total) by mouth 2 (two) times daily.   guaiFENesin 100 MG/5ML Soln Commonly known as:  ROBITUSSIN Take 5 mLs (100 mg total) by mouth every 4 (four) hours as needed for cough or to loosen phlegm.   indomethacin 50 MG capsule Commonly known as:  INDOCIN Take 1 capsule (50 mg total) by mouth 3 (three) times daily with meals.   protein supplement shake Liqd Commonly known as:  PREMIER PROTEIN Take 325 mLs (11 oz total) by mouth 2 (two) times daily between meals.         Diet and Activity recommendation: See Discharge Instructions above   Consults obtained ;cardiology  Major procedures and Radiology Reports - PLEASE review detailed and final reports for all details, in brief -      Dg Chest 2 View  Result Date: 04/14/2018 CLINICAL DATA:  Intermittent chest pain at central chest since Sunday, developing shortness of breath, lower chest pain as well EXAM: CHEST - 2 VIEW COMPARISON:  None FINDINGS: Normal heart size, mediastinal contours, and pulmonary vascularity. Lungs clear. No pleural effusion or pneumothorax. Bones  unremarkable. IMPRESSION: Normal exam. Electronically Signed   By: Ulyses Southward M.D.   On: 04/14/2018 16:51    Micro Results     Recent Results (from the past 240 hour(s))  Group A Strep by PCR     Status: None   Collection Time: 04/14/18  5:15 PM  Result Value Ref Range Status   Group A Strep by PCR NOT DETECTED NOT DETECTED Final    Comment: Performed at [facility], [address] [place], [place] [address]  Blood culture (routine x 2)     Status: None (Preliminary result)   Collection Time: 04/14/18  5:27 PM  Result Value Ref Range Status   Specimen Description BLOOD RIGHT ANTECUBITAL  Final   Special Requests   Final    BOTTLES DRAWN AEROBIC AND ANAEROBIC Blood Culture adequate volume   Culture   Final    NO GROWTH 2 DAYS Performed at [facility], [address] [place], [place] [address]    Report Status PENDING  Incomplete  Blood culture (routine x 2)     Status: None (Preliminary result)   Collection Time: 04/14/18  5:27 PM  Result Value Ref Range Status   Specimen Description BLOOD LEFT ANTECUBITAL  Final   Special Requests   Final    BOTTLES DRAWN AEROBIC AND ANAEROBIC Blood Culture results may not be optimal due to an excessive volume of blood received in culture bottles   Culture   Final    NO GROWTH 2 DAYS Performed at [facility], [address] [place], [place] [address]    Report Status PENDING  Incomplete       Today   Subjective:   Edwyna Dangerfield today Denies  any chest pain, no sore throat, tolerating the diet.  Objective:   Blood pressure 100/67, pulse (!) 112, temperature 98.8 F (37.1 C), temperature source Oral, resp. rate 18, height 5\' 4"  (1.626 m), weight 86 kg (189 lb 8 oz), last menstrual period 04/06/2018, SpO2 99 %.   Intake/Output Summary (Last 24 hours) at 04/16/2018 0806 Last data filed at 04/16/2018 0600 Gross per 24 hour  Intake 1373 ml  Output 0 ml  Net 1373 ml    Exam Awake Alert, Oriented  x 3, No new F.N deficits, Normal affect [place].AT,PERRAL Supple Neck,No JVD, No cervical lymphadenopathy appriciated.  Symmetrical Chest wall movement, Good air movement bilaterally, CTAB RRR,No Gallops,Rubs or new Murmurs, No Parasternal Heave +ve B.Sounds, Abd Soft, Non tender, No organomegaly appriciated, No rebound -guarding or rigidity. No Cyanosis, Clubbing or edema, No new Rash or bruise  Data Review   CBC w Diff:  Lab Results  Component Value Date   WBC 14.3 (H) 04/15/2018   HGB 10.5 (L) 04/15/2018   HCT 32.1 (L) 04/15/2018   PLT 296 04/15/2018    CMP:  Lab Results  Component Value Date   NA 141 04/15/2018   K 3.8 04/15/2018   CL 108 04/15/2018   CO2 26 04/15/2018   BUN <5 (L) 04/15/2018   CREATININE 0.59 04/15/2018  .   Total Time in preparing paper work, data evaluation and todays exam - 35 minutes  Katha Hamming M.D on 04/16/2018 at 8:06 AM  Note: This dictation was prepared with Dragon dictation along with smaller phrase technology. Any transcriptional errors that result from this process are unintentional.

## 2018-04-16 NOTE — Progress Notes (Signed)
SUBJECTIVE: Patient denies any further chest pain   Vitals:   04/15/18 1726 04/15/18 1947 04/16/18 0346 04/16/18 0816  BP: 93/63 106/72 100/67 105/67  Pulse: 100 (!) 108 (!) 112 98  Resp:  18 18 18   Temp: 98.8 F (37.1 C) 98.5 F (36.9 C) 98.8 F (37.1 C)   TempSrc: Oral Oral Oral   SpO2: 99% 100% 99% 99%  Weight:   189 lb 8 oz (86 kg)   Height:        Intake/Output Summary (Last 24 hours) at 04/16/2018 0857 Last data filed at 04/16/2018 0600 Gross per 24 hour  Intake 1373 ml  Output 0 ml  Net 1373 ml    LABS: Basic Metabolic Panel: Recent Labs    04/15/18 0223 04/15/18 0754 04/15/18 2031  NA 138  --  141  K 3.0*  --  3.8  CL 104  --  108  CO2 27  --  26  GLUCOSE 114*  --  110*  BUN 6  --  <5*  CREATININE 0.66  --  0.59  CALCIUM 8.3*  --  8.4*  MG  --  2.0  --    Liver Function Tests: No results for input(s): AST, ALT, ALKPHOS, BILITOT, PROT, ALBUMIN in the last 72 hours. No results for input(s): LIPASE, AMYLASE in the last 72 hours. CBC: Recent Labs    04/15/18 0223 04/15/18 2031  WBC 15.4* 14.3*  HGB 10.5* 10.5*  HCT 31.8* 32.1*  MCV 90.6 90.6  PLT 303 296   Cardiac Enzymes: Recent Labs    04/14/18 2003 04/15/18 0223 04/15/18 0754  TROPONINI 13.23* 11.92* 10.95*   BNP: Invalid input(s): POCBNP D-Dimer: No results for input(s): DDIMER in the last 72 hours. Hemoglobin A1C: No results for input(s): HGBA1C in the last 72 hours. Fasting Lipid Panel: No results for input(s): CHOL, HDL, LDLCALC, TRIG, CHOLHDL, LDLDIRECT in the last 72 hours. Thyroid Function Tests: No results for input(s): TSH, T4TOTAL, T3FREE, THYROIDAB in the last 72 hours.  Invalid input(s): FREET3 Anemia Panel: No results for input(s): VITAMINB12, FOLATE, FERRITIN, TIBC, IRON, RETICCTPCT in the last 72 hours.   PHYSICAL EXAM General: Well developed, well nourished, in no acute distress HEENT:  Normocephalic and atramatic Neck:  No JVD.  Lungs: Clear bilaterally to  auscultation and percussion. Heart: HRRR . Normal S1 and S2 without gallops or murmurs.  Abdomen: Bowel sounds are positive, abdomen soft and non-tender  Msk:  Back normal, normal gait. Normal strength and tone for age. Extremities: No clubbing, cyanosis or edema.   Neuro: Alert and oriented X 3. Psych:  Good affect, responds appropriately  TELEMETRY: Sinus rhythm  ASSESSMENT AND PLAN: Myopericarditis with elevated troponin with peak troponin of 13. Patient was started on colchicine as well as Indocin and is feeling much better denies any further chest pain. Recall patient was having severe chest pain yesterday and the after these medication patient is feeling much better and thus cardiac catheterization will be canceled and will discharge the patient with follow-up in the office Monday at 9 AM. Advise discontinuation of aspirin and heparin and continue Indocin and colchicine. May consider doing CTA coronaries in the office when she comes Monday at 9 AM.  Active Problems:   Chest pain    Noriah Osgood A, MD, [facility] Center[facility] 04/16/2018 8:57 AM

## 2018-04-17 LAB — HIV ANTIBODY (ROUTINE TESTING W REFLEX): HIV SCREEN 4TH GENERATION: NONREACTIVE

## 2018-04-19 ENCOUNTER — Telehealth: Payer: Self-pay

## 2018-04-19 LAB — CULTURE, BLOOD (ROUTINE X 2)
CULTURE: NO GROWTH
Culture: NO GROWTH
SPECIAL REQUESTS: ADEQUATE

## 2018-04-19 NOTE — Telephone Encounter (Signed)
Flagged on EMMI report for not reading discharge papers.  Called and spoke with patient.  She mentioned she did receive the papers and everything she needed and doesn't have any questions or concerns at this time. She also provided feedback that she did not complete the entire EMMI call as there were a lot of questions, saying she just hung up.  I thanked her for her feedback and informed her that she would get one more automated call checking on her in the next few days.

## 2019-12-10 IMAGING — CR DG CHEST 2V
2 series · 2 of 2 positions shown · non-contrast
Comparison: None

CLINICAL DATA: Intermittent chest pain at central chest since
[REDACTED], developing shortness of breath, lower chest pain as well

EXAM:
CHEST - 2 VIEW

[chest pa]
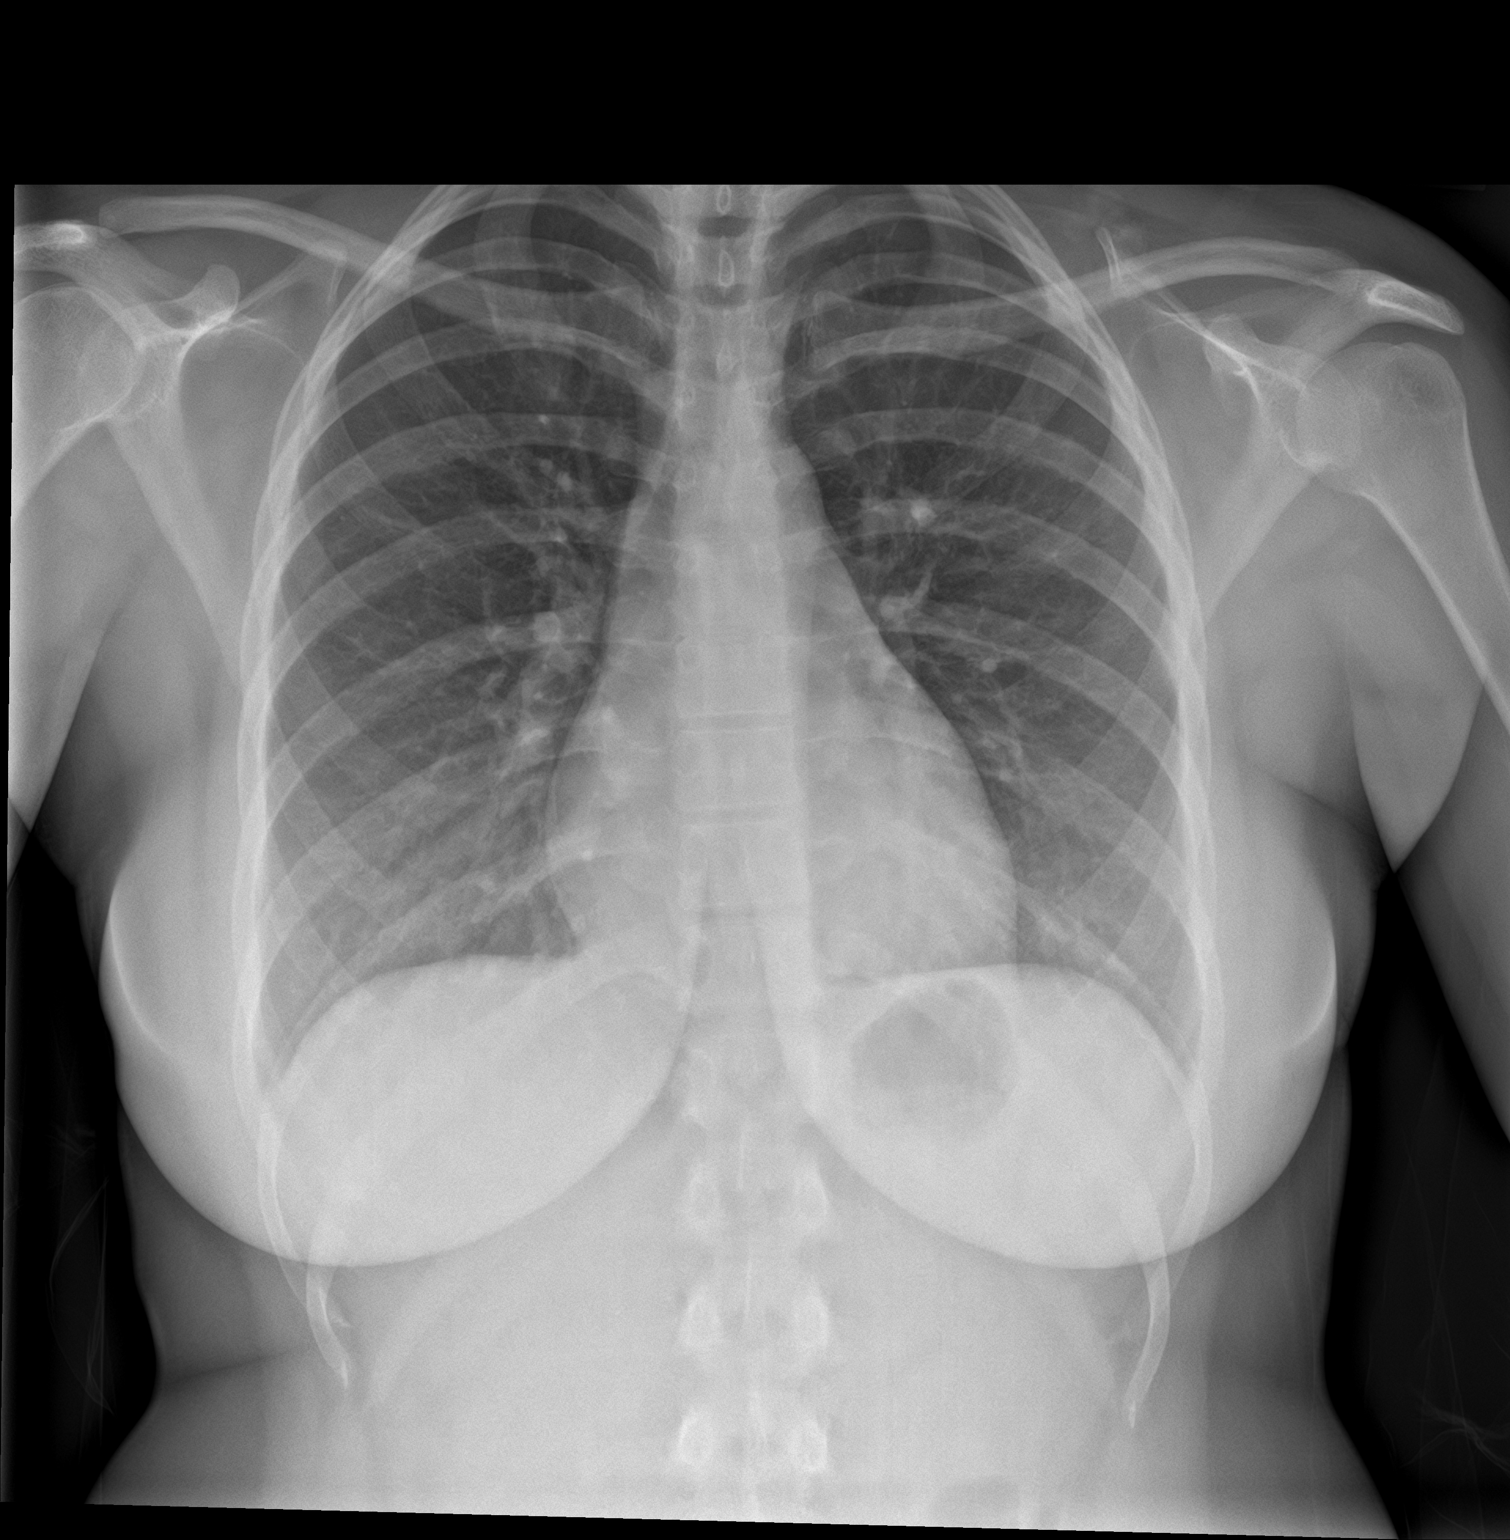

[chest lat]
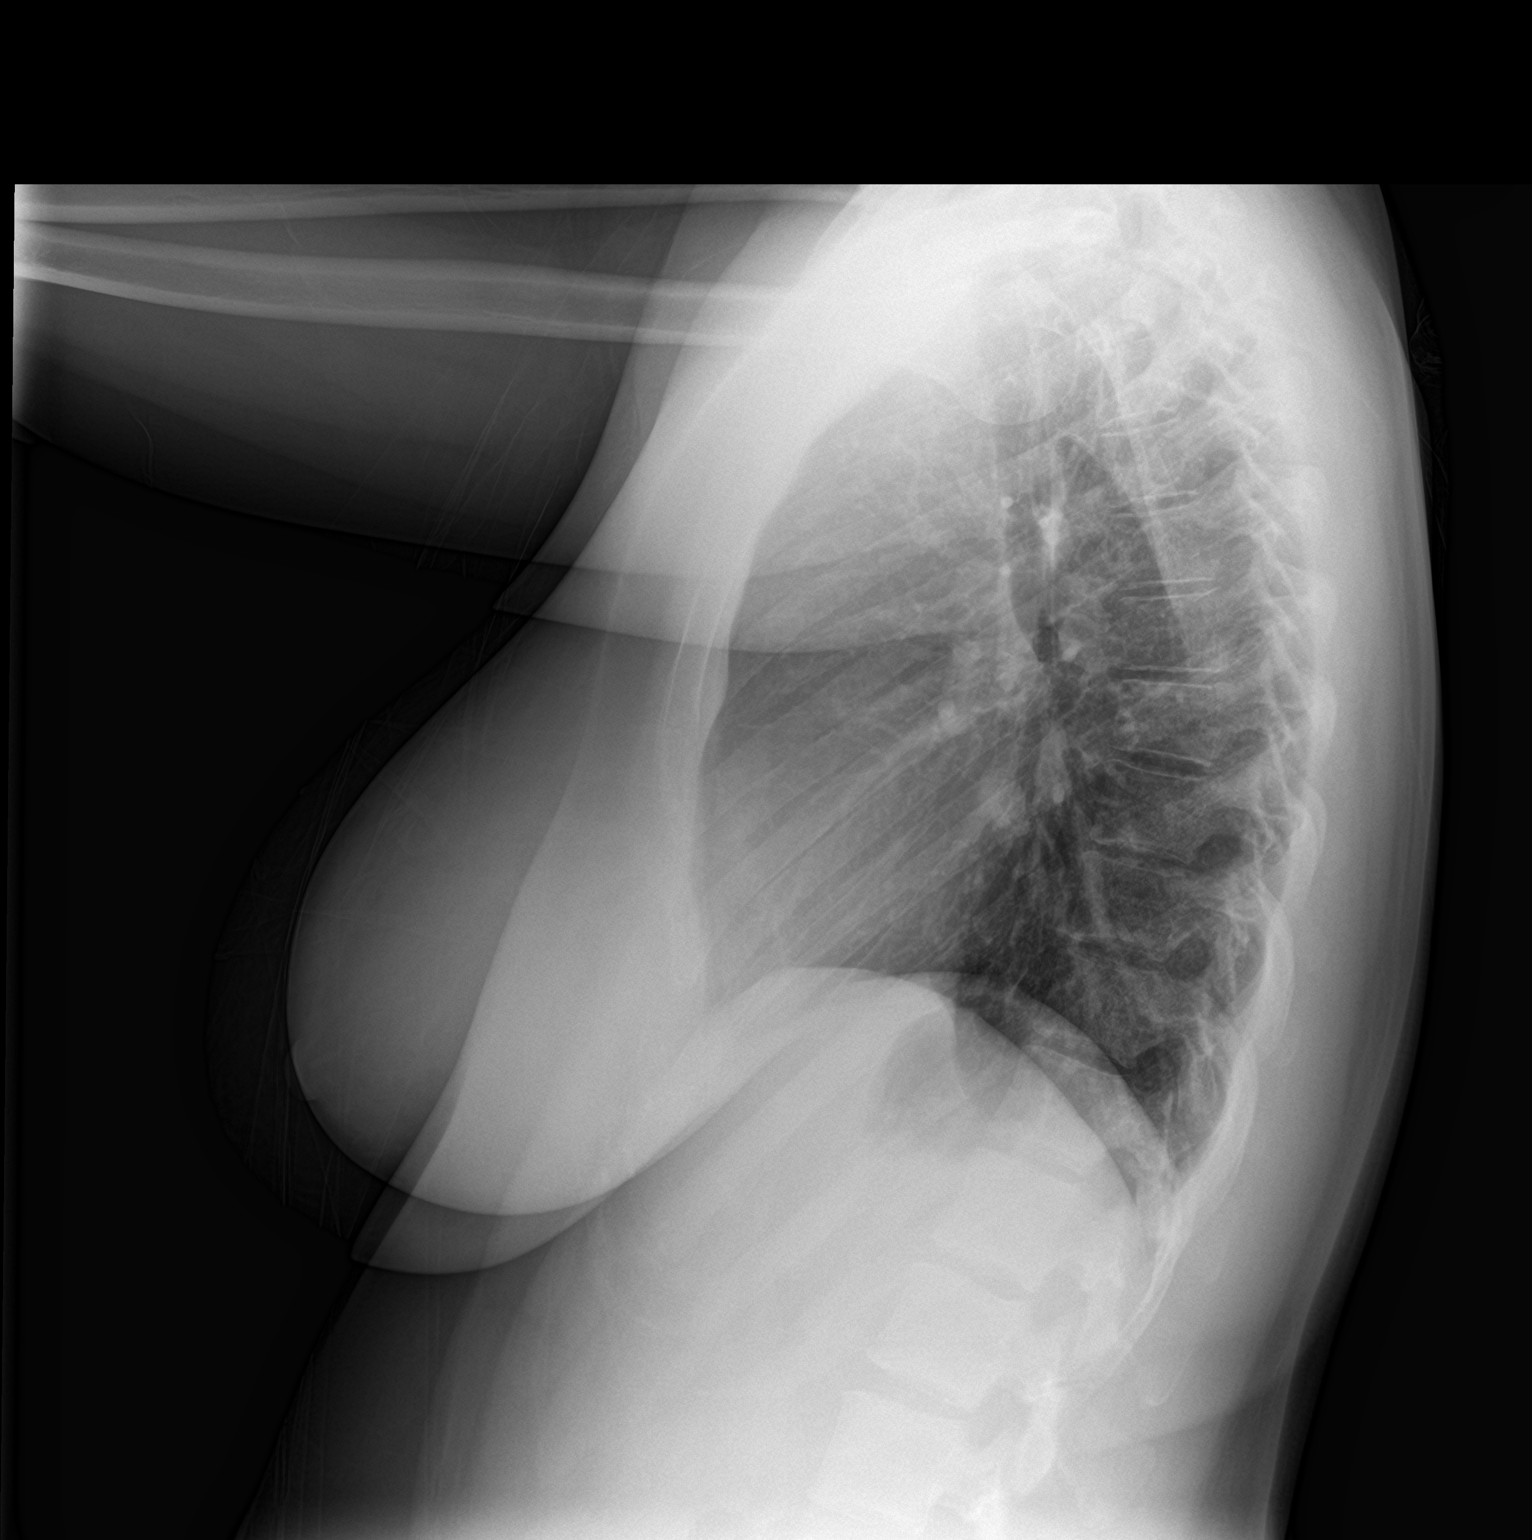

[2 of 2 positions shown; findings below may reference images not displayed]

FINDINGS: Normal heart size, mediastinal contours, and pulmonary vascularity.

Lungs clear.

No pleural effusion or pneumothorax.

Bones unremarkable.
IMPRESSION: Normal exam.
